# Patient Record
Sex: Male | Born: 1960 | Hispanic: No | Marital: Married | Smoking: Never smoker
Health system: Southern US, Community
[De-identification: ages and names within clinical notes are randomized; demographics above are authoritative.]

## PROBLEM LIST (undated history)

## (undated) HISTORY — PX: EYE SURGERY: SHX253

## (undated) HISTORY — PX: ORIF ANKLE FRACTURE: SHX5408

---

## 2004-07-06 ENCOUNTER — Ambulatory Visit (HOSPITAL_COMMUNITY): Admission: RE | Admit: 2004-07-06 | Discharge: 2004-07-06 | Payer: Self-pay | Admitting: General Surgery

## 2005-08-13 ENCOUNTER — Other Ambulatory Visit: Admission: RE | Admit: 2005-08-13 | Discharge: 2005-08-13 | Payer: Self-pay | Admitting: Family Medicine

## 2005-08-23 ENCOUNTER — Encounter: Admission: RE | Admit: 2005-08-23 | Discharge: 2005-08-23 | Payer: Self-pay | Admitting: Family Medicine

## 2006-10-28 IMAGING — CR DG ELBOW COMPLETE 3+V*R*
2 series · 2 of 2 positions shown · non-contrast
Comparison: none

CLINICAL DATA: Swollen area of right elbow.  This area was drained approximately 1 week ago.  No pain at the present time. 
 RIGHT ELBOW ? 4 VIEW:

[view not recorded (1 of 2)]
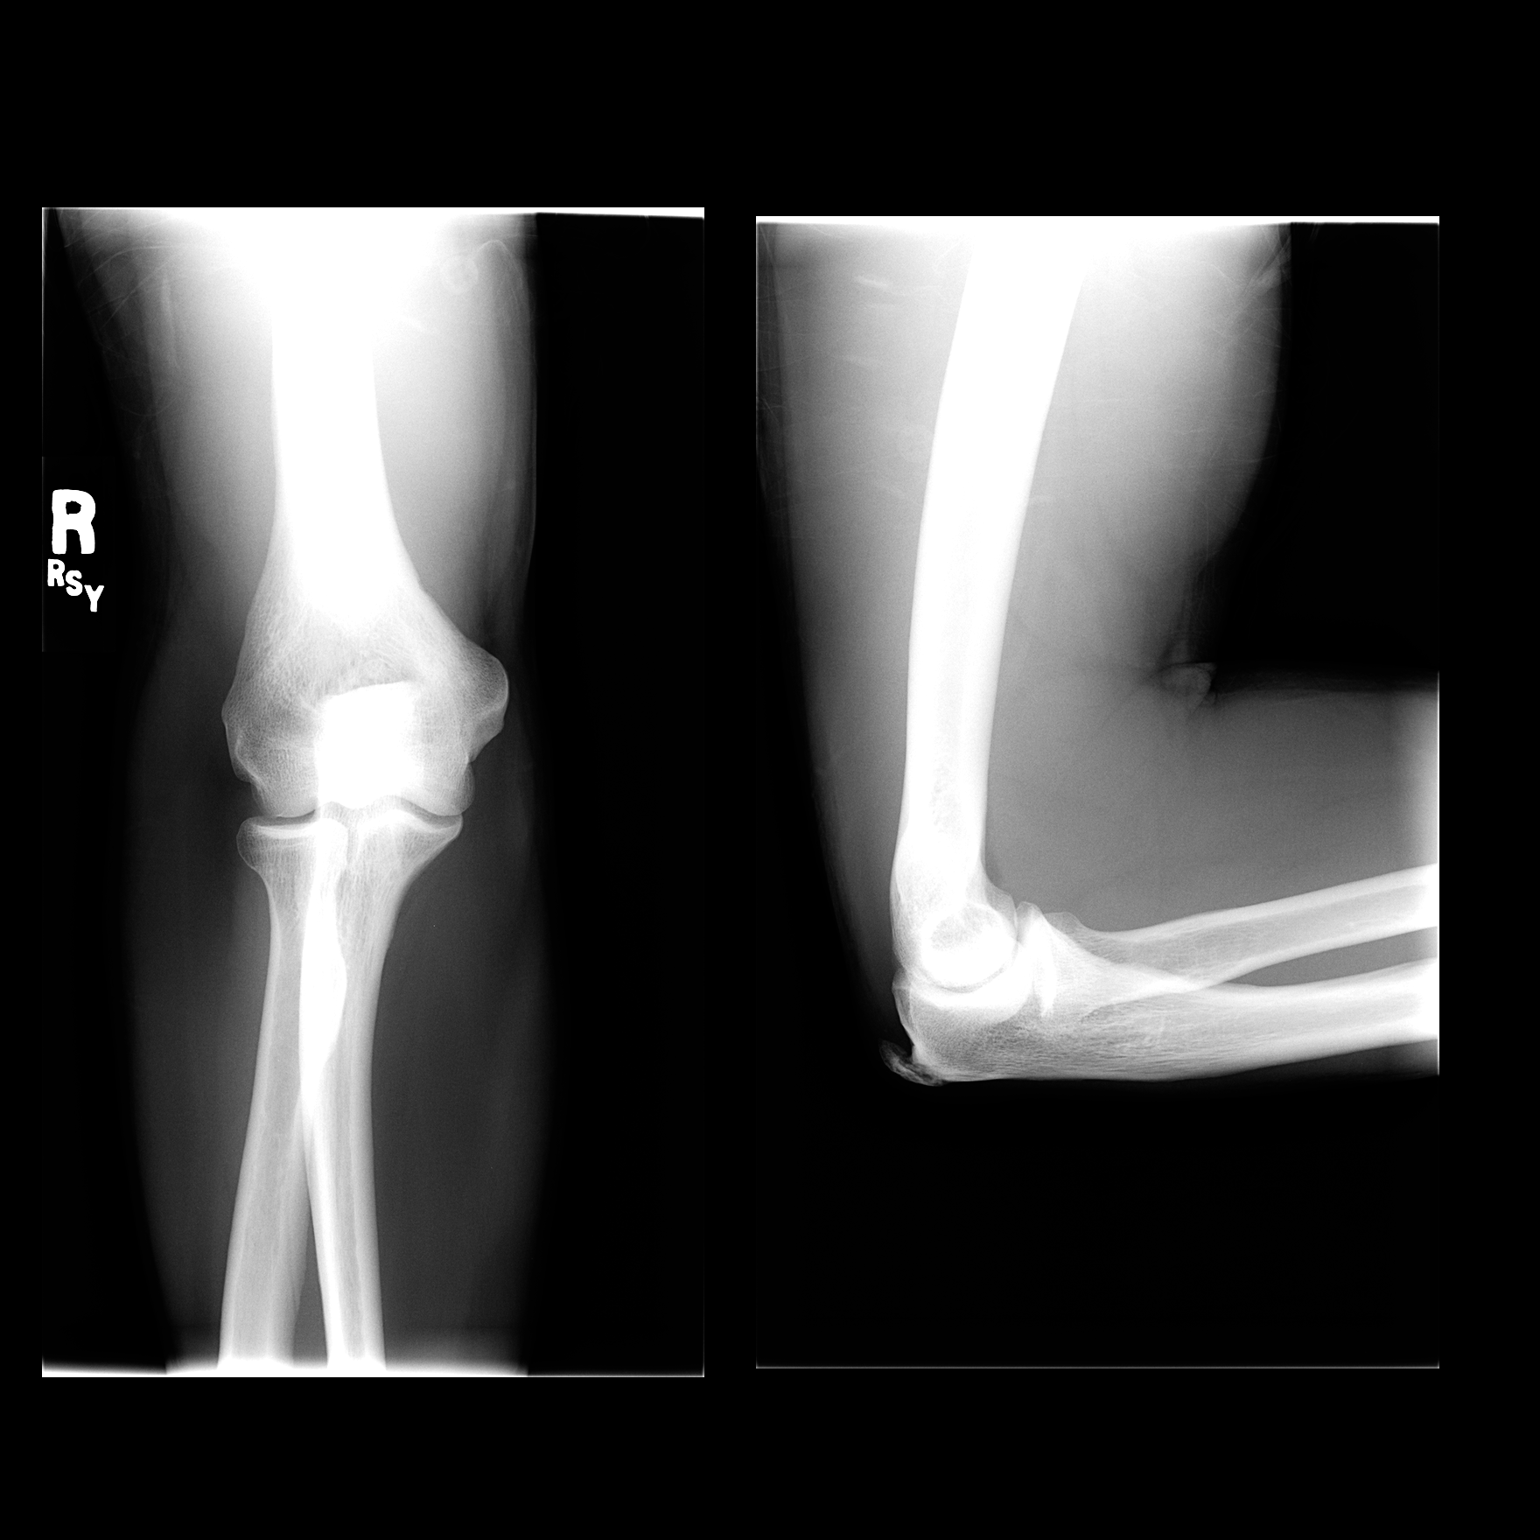

[view not recorded (2 of 2)]
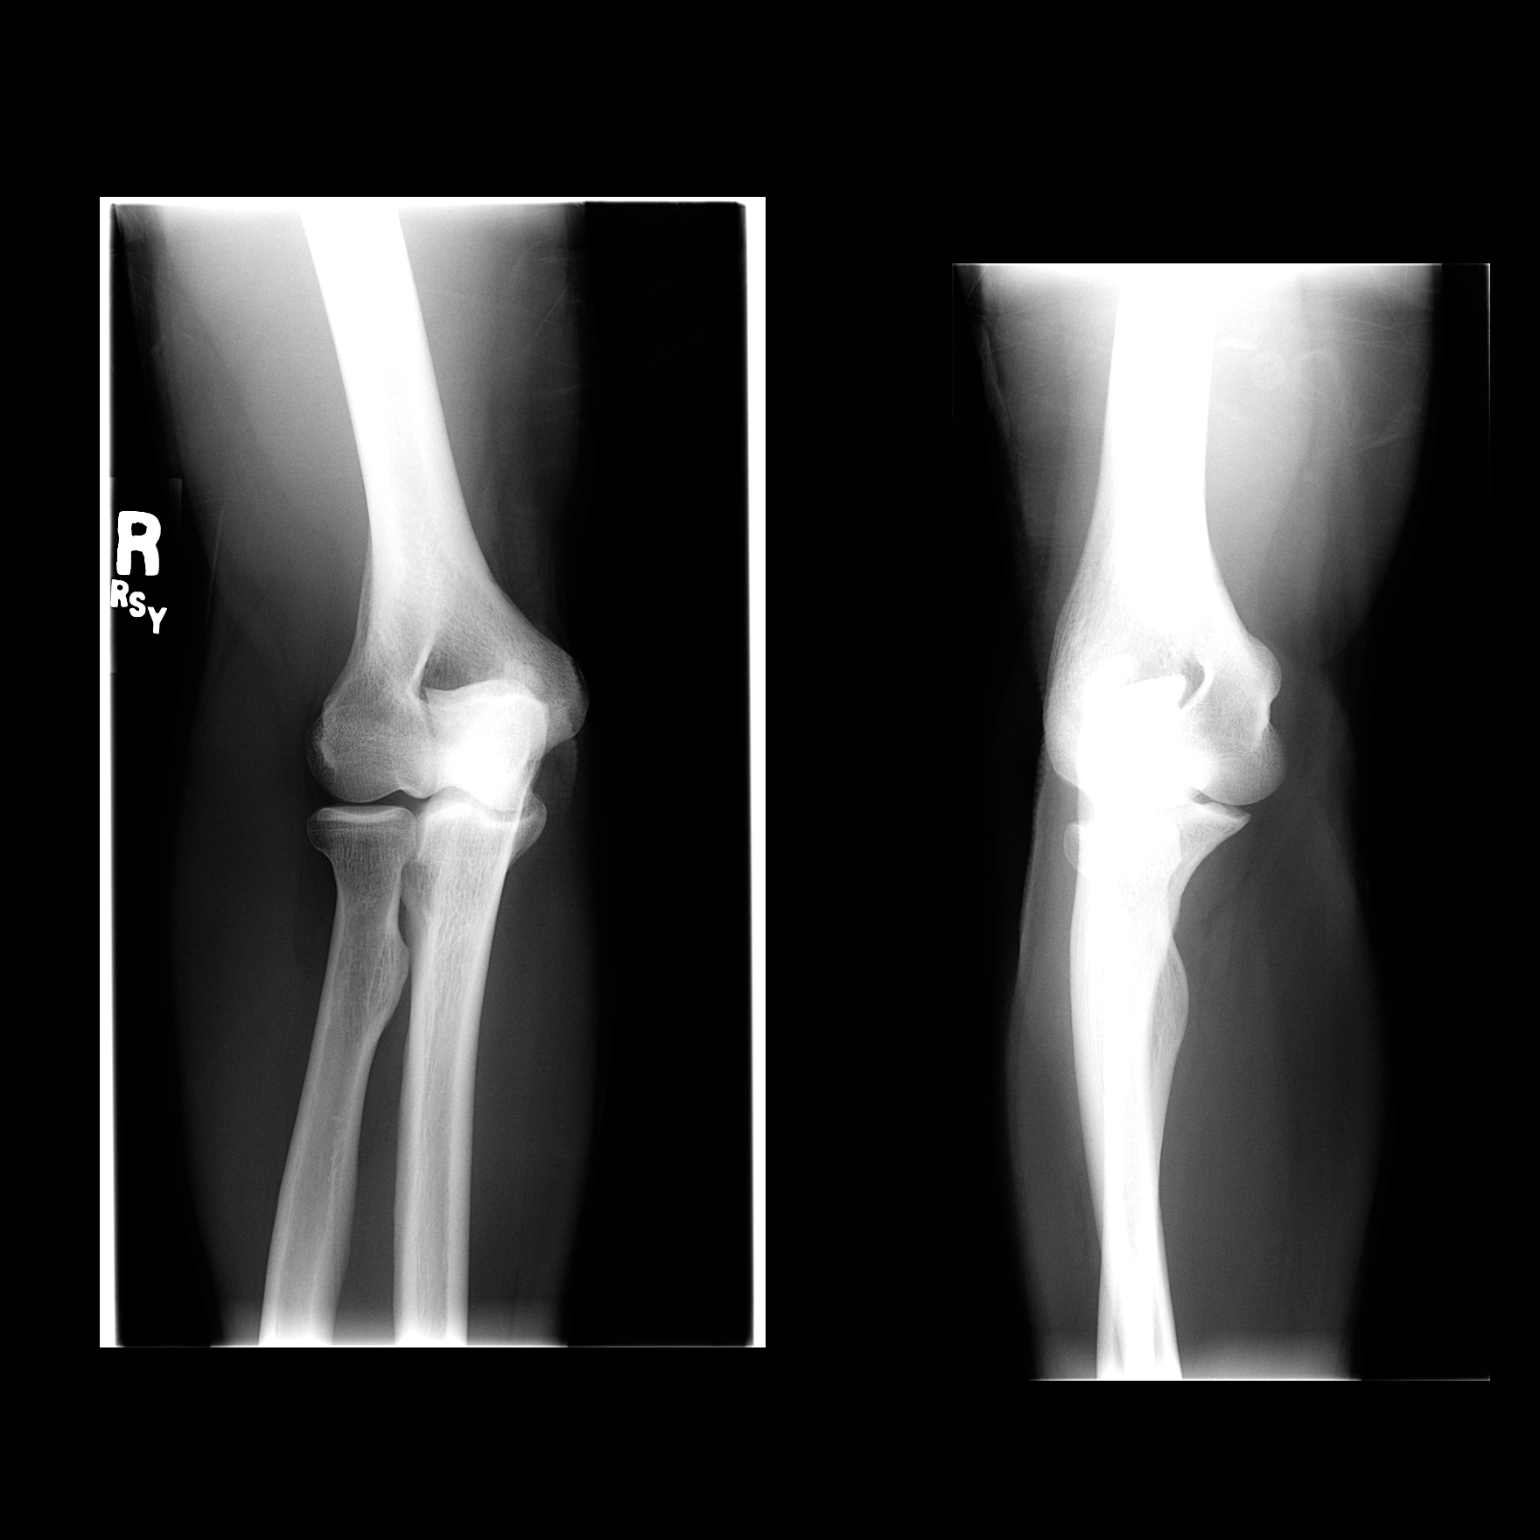

[2 of 2 positions shown; findings below may reference images not displayed]

FINDINGS: AP, lateral, and both oblique views of the elbow are made and show a large calcified olecranon spur.  There is no fracture, foreign body or osteomyelitis.  The distal humeral fat pads are well preserved.
IMPRESSION: Large olecranon spur without fracture.  No evidence of osteomyelitis or foreign body.

## 2010-06-03 HISTORY — PX: HEMORRHOID SURGERY: SHX153

## 2021-02-21 DIAGNOSIS — H2513 Age-related nuclear cataract, bilateral: Secondary | ICD-10-CM | POA: Diagnosis not present

## 2021-02-21 DIAGNOSIS — H00025 Hordeolum internum left lower eyelid: Secondary | ICD-10-CM | POA: Diagnosis not present

## 2021-05-01 DIAGNOSIS — Z0000 Encounter for general adult medical examination without abnormal findings: Secondary | ICD-10-CM | POA: Diagnosis not present

## 2021-05-01 DIAGNOSIS — Z1329 Encounter for screening for other suspected endocrine disorder: Secondary | ICD-10-CM | POA: Diagnosis not present

## 2021-05-01 DIAGNOSIS — R030 Elevated blood-pressure reading, without diagnosis of hypertension: Secondary | ICD-10-CM | POA: Diagnosis not present

## 2021-05-01 DIAGNOSIS — Z125 Encounter for screening for malignant neoplasm of prostate: Secondary | ICD-10-CM | POA: Diagnosis not present

## 2021-05-01 DIAGNOSIS — Z13220 Encounter for screening for lipoid disorders: Secondary | ICD-10-CM | POA: Diagnosis not present

## 2021-06-14 DIAGNOSIS — I10 Essential (primary) hypertension: Secondary | ICD-10-CM | POA: Insufficient documentation

## 2021-07-09 DIAGNOSIS — H2513 Age-related nuclear cataract, bilateral: Secondary | ICD-10-CM | POA: Diagnosis not present

## 2021-07-16 DIAGNOSIS — I10 Essential (primary) hypertension: Secondary | ICD-10-CM | POA: Diagnosis not present

## 2021-08-30 DIAGNOSIS — R42 Dizziness and giddiness: Secondary | ICD-10-CM | POA: Diagnosis not present

## 2021-08-30 DIAGNOSIS — Z131 Encounter for screening for diabetes mellitus: Secondary | ICD-10-CM | POA: Diagnosis not present

## 2021-08-30 DIAGNOSIS — I10 Essential (primary) hypertension: Secondary | ICD-10-CM | POA: Diagnosis not present

## 2022-04-07 DIAGNOSIS — S060X0A Concussion without loss of consciousness, initial encounter: Secondary | ICD-10-CM | POA: Diagnosis not present

## 2022-04-07 DIAGNOSIS — M542 Cervicalgia: Secondary | ICD-10-CM | POA: Diagnosis not present

## 2022-04-07 DIAGNOSIS — S161XXA Strain of muscle, fascia and tendon at neck level, initial encounter: Secondary | ICD-10-CM | POA: Diagnosis not present

## 2022-07-10 DIAGNOSIS — H2513 Age-related nuclear cataract, bilateral: Secondary | ICD-10-CM | POA: Diagnosis not present

## 2022-07-10 DIAGNOSIS — H11153 Pinguecula, bilateral: Secondary | ICD-10-CM | POA: Diagnosis not present

## 2022-09-04 DIAGNOSIS — H00021 Hordeolum internum right upper eyelid: Secondary | ICD-10-CM | POA: Diagnosis not present

## 2022-10-22 DIAGNOSIS — H0011 Chalazion right upper eyelid: Secondary | ICD-10-CM | POA: Diagnosis not present

## 2022-11-25 ENCOUNTER — Encounter: Payer: Self-pay | Admitting: Family Medicine

## 2022-11-25 ENCOUNTER — Ambulatory Visit (INDEPENDENT_AMBULATORY_CARE_PROVIDER_SITE_OTHER): Payer: BC Managed Care – PPO | Admitting: Family Medicine

## 2022-11-25 VITALS — BP 124/70 | HR 78 | Temp 97.7°F | Ht 69.0 in | Wt 171.8 lb

## 2022-11-25 DIAGNOSIS — Z0000 Encounter for general adult medical examination without abnormal findings: Secondary | ICD-10-CM

## 2022-11-25 DIAGNOSIS — Z23 Encounter for immunization: Secondary | ICD-10-CM | POA: Diagnosis not present

## 2022-11-25 NOTE — Progress Notes (Signed)
[facility] [address] [place] [address] Dept: [phone] Dept Fax: [phone]  New Patient Office Visit  Subjective:    Patient ID: Jeremy Barnett, male    DOB: 06/15/1960, 62 y.o..   MRN: [id number]  Chief Complaint  Patient presents with   Establish Care    NP-establish care.  No concerns.  Not fasting today.    History of Present Illness:  Patient is in today to establish care. Jeremy [place] was born in [place], [place]. He attended [employer], majoring in Development worker, international aid. 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 Jeremy [employer]ett has been married for 38 years. he has two children: a son (29) and a daughter (52). He has five grandchildren. [place] denies use of tobacco or drugs. He drinks 1-2 beers a day.  Review of Systems  Constitutional:  Negative for chills, diaphoresis, fever, malaise/fatigue and weight loss.  HENT:  Negative for congestion, ear pain, hearing loss, sinus pain, sore throat and tinnitus.   Eyes:  Negative for blurred vision, pain, discharge and redness.  Respiratory:  Negative for cough, shortness of breath and wheezing.   Cardiovascular:  Negative for chest pain and palpitations.  Gastrointestinal:  Negative for abdominal pain, constipation, diarrhea, heartburn, nausea and vomiting.  Musculoskeletal:  Positive for joint pain. Negative for back pain and myalgias.       Has hardware present in his right ankle. This occasionally bothers him when he is wearing boots.  Skin:  Negative for itching and rash.  Psychiatric/Behavioral:  Negative for depression. The patient is not nervous/anxious.    Past Medical History: There are no problems to display for this patient.  Past Surgical History:  Procedure Laterality Date   HEMORRHOID SURGERY  2012   ORIF ANKLE FRACTURE Right    Family History  Problem Relation Age of Onset    Hypertension Mother    Cancer Father        prostrate   Diabetes Maternal Grandfather    Cancer Paternal Grandmother        bladder   No outpatient medications prior to visit.   No facility-administered medications prior to visit.   No Known Allergies Objective:   Today's Vitals   11/25/22 0824  BP: 124/70  Pulse: 78  Temp: 97.7 F (36.5 C)  TempSrc: Temporal  SpO2: 99%  Weight: 171 lb 12.8 oz (77.9 kg)  Height: 5\' 9"  (1.753 m)   Body mass index is 25.37 kg/m.   General: Well developed, well nourished. No acute distress. HEENT: Normocephalic, non-traumatic. PERRL, EOMI. Conjunctiva clear. External ears normal. EAC and TMs normal   bilaterally. Nose clear without congestion or rhinorrhea. Mucous membranes moist. Oropharynx clear. Good dentition. Neck: Supple. No lymphadenopathy. No thyromegaly. Lungs: Clear to auscultation bilaterally. No wheezing, rales or rhonchi. CV: RRR without murmurs or rubs. Pulses 2+ bilaterally. Abdomen: Soft, non-tender. Bowel sounds positive, normal pitch and frequency. No hepatosplenomegaly. No rebound or   guarding. Back: Straight. No CVA tenderness bilaterally. Extremities: Full ROM. No joint swelling or tenderness. No edema noted. Skin: Warm and dry. No rashes. Neuro: CN II-XII intact. Normal sensation and DTR bilaterally. Psych: Alert and oriented. Normal mood and affect.  Health Maintenance Due  Topic Date Due   HIV Screening  Never done   Hepatitis C Screening  Never done   Colonoscopy  Never done   Zoster Vaccines- Shingrix (1 of 2) Never done   DTaP/Tdap/Td (2 -  Td or Tdap) 10/09/2020     Assessment & Plan:   Problem List Items Addressed This Visit   None Visit Diagnoses     Annual physical exam    -  Primary   Overall excellent health. I will obtain prior medical screenings, but patient voices he is UTD on screenings. I encourage ongoign reuglar exercise.   Need for Tdap vaccination       Relevant Orders   Tdap vaccine  greater than or equal to 7yo IM       Return in about 1 year (around 11/25/2023) for Reassessment.   Loyola Mast, MD

## 2023-07-16 DIAGNOSIS — H524 Presbyopia: Secondary | ICD-10-CM | POA: Diagnosis not present

## 2023-07-16 DIAGNOSIS — H35033 Hypertensive retinopathy, bilateral: Secondary | ICD-10-CM | POA: Diagnosis not present

## 2023-07-16 DIAGNOSIS — B880 Other acariasis: Secondary | ICD-10-CM | POA: Diagnosis not present

## 2023-07-16 DIAGNOSIS — H2513 Age-related nuclear cataract, bilateral: Secondary | ICD-10-CM | POA: Diagnosis not present

## 2023-07-16 DIAGNOSIS — H52223 Regular astigmatism, bilateral: Secondary | ICD-10-CM | POA: Diagnosis not present

## 2023-10-30 ENCOUNTER — Encounter: Admitting: Family Medicine

## 2023-11-21 ENCOUNTER — Telehealth: Payer: Self-pay | Admitting: Family Medicine

## 2023-11-21 NOTE — Telephone Encounter (Signed)
 error

## 2023-11-25 ENCOUNTER — Encounter: Payer: BC Managed Care – PPO | Admitting: Family Medicine

## 2023-12-10 ENCOUNTER — Ambulatory Visit (INDEPENDENT_AMBULATORY_CARE_PROVIDER_SITE_OTHER): Admitting: Family Medicine

## 2023-12-10 ENCOUNTER — Encounter: Payer: Self-pay | Admitting: Family Medicine

## 2023-12-10 ENCOUNTER — Ambulatory Visit: Payer: Self-pay | Admitting: Family Medicine

## 2023-12-10 VITALS — BP 134/72 | HR 69 | Temp 97.6°F | Ht 69.0 in | Wt 169.6 lb

## 2023-12-10 DIAGNOSIS — Z0000 Encounter for general adult medical examination without abnormal findings: Secondary | ICD-10-CM | POA: Insufficient documentation

## 2023-12-10 DIAGNOSIS — Z125 Encounter for screening for malignant neoplasm of prostate: Secondary | ICD-10-CM | POA: Diagnosis not present

## 2023-12-10 LAB — PSA: PSA: 0.5 ng/mL (ref 0.10–4.00)

## 2023-12-10 NOTE — Progress Notes (Signed)
 [facility] [address] [place] [address] Dept: [phone] Dept Fax: [phone]  Annual Physical Visit  Subjective:    Patient ID: [employer], male    DOB: 26-Nov-1960, 63 y.o..   MRN: [id number]  Chief Complaint  Patient presents with   Annual Exam    CPE/labs.   No concerns.  Not fasting.    History of Present Illness:  Patient is in today for an annual physical/preventative visit.  Notes he is well overall. Plans retirement by next Spring.  Review of Systems  Constitutional:  Negative for chills, diaphoresis, fever, malaise/fatigue and weight loss.  HENT:  Negative for congestion, ear pain, hearing loss, sinus pain, sore throat and tinnitus.   Eyes:  Negative for blurred vision, pain, discharge and redness.  Respiratory:  Negative for cough, shortness of breath and wheezing.   Cardiovascular:  Negative for chest pain and palpitations.  Gastrointestinal:  Negative for abdominal pain, constipation, diarrhea, heartburn, nausea and vomiting.  Musculoskeletal:  Negative for back pain, joint pain and myalgias.  Skin:  Negative for itching and rash.  Psychiatric/Behavioral:  Negative for depression. The patient is not nervous/anxious.    Past Medical History: There are no active problems to display for this patient.  Past Surgical History:  Procedure Laterality Date   EYE SURGERY     HEMORRHOID SURGERY  2012   ORIF ANKLE FRACTURE Right    Family History  Problem Relation Age of Onset   Hypertension Mother    Cancer Father        prostrate   Diabetes Maternal Grandfather    Cancer Paternal Grandmother        bladder   Outpatient Medications Prior to Visit  Medication Sig Dispense Refill   loratadine (CLARITIN) 10 MG tablet Take 10 mg by mouth daily.     Omega-3 Fatty Acids (FISH OIL) 1000 MG CAPS Take 1 g by mouth.     psyllium (METAMUCIL) 58.6 % powder Take by mouth.     No facility-administered  medications prior to visit.   No Known Allergies Objective:   Today's Vitals   12/10/23 1337  BP: 134/72  Pulse: 69  Temp: 97.6 F (36.4 C)  TempSrc: Temporal  SpO2: 98%  Weight: 169 lb 9.6 oz (76.9 kg)  Height: 5' 9 (1.753 m)   Body mass index is 25.05 kg/m.   General: Well developed, well nourished. No acute distress. HEENT: Normocephalic, non-traumatic. PERRL, EOMI. Conjunctiva clear. External ears normal. EAC and TMs normal   bilaterally. Nose clear without congestion or rhinorrhea. Mucous membranes moist. Oropharynx clear. Good dentition. Neck: Supple. No lymphadenopathy. No thyromegaly. Lungs: Clear to auscultation bilaterally. No wheezing, rales or rhonchi. CV: RRR without murmurs or rubs. Pulses 2+ bilaterally. Abdomen: Soft, non-tender. Bowel sounds positive, normal pitch and frequency. No hepatosplenomegaly. No rebound or   guarding. Extremities: Full ROM. No joint swelling or tenderness. No edema noted. Skin: Warm and dry. No rashes. Psych: Alert and oriented. Normal mood and affect.  Health Maintenance Due  Topic Date Due   HIV Screening  Never done   Hepatitis C Screening  Never done   Colonoscopy  Never done   Pneumococcal Vaccine: 50+ Years (1 of 1 - PCV) Never done   Zoster Vaccines- Shingrix (1 of 2) Never done   Assessment & Plan:   Problem List Items Addressed This Visit       Other   Annual physical exam - Primary   Overall  health is excellent. Recommend regular exercise. Discussed recommended screenings and immunizations.       Other Visit Diagnoses       Screening for prostate cancer       Relevant Orders   PSA (Completed)       Return in about 1 year (around 12/09/2024) for Annual preventative care (Welcome to Medicare visit).   Garnette CHRISTELLA Simpler, MD

## 2023-12-10 NOTE — Assessment & Plan Note (Signed)
 Overall health is excellent. Recommend regular exercise. Discussed recommended screenings and immunizations.

## 2023-12-25 ENCOUNTER — Encounter: Admitting: Family Medicine

## 2024-12-15 ENCOUNTER — Encounter: Admitting: Family Medicine

## 2024-12-21 ENCOUNTER — Encounter: Admitting: Family Medicine
